# Patient Record
Sex: Male | Born: 1965
Health system: Southern US, Community
[De-identification: ages and names within clinical notes are randomized; demographics above are authoritative.]

## PROBLEM LIST (undated history)

## (undated) DIAGNOSIS — E7800 Pure hypercholesterolemia, unspecified: Secondary | ICD-10-CM

## (undated) DIAGNOSIS — M519 Unspecified thoracic, thoracolumbar and lumbosacral intervertebral disc disorder: Secondary | ICD-10-CM

## (undated) DIAGNOSIS — I10 Essential (primary) hypertension: Secondary | ICD-10-CM

## (undated) SURGERY — Surgical Case
Anesthesia: *Unknown

---

## 2004-04-11 ENCOUNTER — Ambulatory Visit: Payer: Self-pay | Admitting: Family Medicine

## 2004-08-08 ENCOUNTER — Ambulatory Visit: Payer: Self-pay | Admitting: Family Medicine

## 2006-05-12 DIAGNOSIS — R209 Unspecified disturbances of skin sensation: Secondary | ICD-10-CM | POA: Insufficient documentation

## 2006-05-26 ENCOUNTER — Ambulatory Visit: Payer: Self-pay | Admitting: Internal Medicine

## 2006-05-26 ENCOUNTER — Encounter (INDEPENDENT_AMBULATORY_CARE_PROVIDER_SITE_OTHER): Payer: Self-pay | Admitting: *Deleted

## 2006-05-26 ENCOUNTER — Encounter: Payer: Self-pay | Admitting: Family Medicine

## 2008-02-17 ENCOUNTER — Ambulatory Visit: Payer: Self-pay | Admitting: Internal Medicine

## 2008-02-17 DIAGNOSIS — J069 Acute upper respiratory infection, unspecified: Secondary | ICD-10-CM | POA: Insufficient documentation

## 2016-08-17 DIAGNOSIS — I10 Essential (primary) hypertension: Secondary | ICD-10-CM | POA: Diagnosis not present

## 2016-10-06 DIAGNOSIS — H04123 Dry eye syndrome of bilateral lacrimal glands: Secondary | ICD-10-CM | POA: Diagnosis not present

## 2016-10-29 DIAGNOSIS — Z79899 Other long term (current) drug therapy: Secondary | ICD-10-CM | POA: Diagnosis not present

## 2016-10-29 DIAGNOSIS — Z13220 Encounter for screening for lipoid disorders: Secondary | ICD-10-CM | POA: Diagnosis not present

## 2016-10-29 DIAGNOSIS — Z0000 Encounter for general adult medical examination without abnormal findings: Secondary | ICD-10-CM | POA: Diagnosis not present

## 2016-10-29 DIAGNOSIS — I10 Essential (primary) hypertension: Secondary | ICD-10-CM | POA: Diagnosis not present

## 2016-10-29 DIAGNOSIS — Z125 Encounter for screening for malignant neoplasm of prostate: Secondary | ICD-10-CM | POA: Diagnosis not present

## 2016-10-29 DIAGNOSIS — J9811 Atelectasis: Secondary | ICD-10-CM | POA: Diagnosis not present

## 2016-11-04 DIAGNOSIS — Z1211 Encounter for screening for malignant neoplasm of colon: Secondary | ICD-10-CM | POA: Diagnosis not present

## 2016-11-09 DIAGNOSIS — I10 Essential (primary) hypertension: Secondary | ICD-10-CM | POA: Diagnosis not present

## 2016-11-10 DIAGNOSIS — H04123 Dry eye syndrome of bilateral lacrimal glands: Secondary | ICD-10-CM | POA: Diagnosis not present

## 2016-12-07 DIAGNOSIS — M9903 Segmental and somatic dysfunction of lumbar region: Secondary | ICD-10-CM | POA: Diagnosis not present

## 2016-12-07 DIAGNOSIS — M5432 Sciatica, left side: Secondary | ICD-10-CM | POA: Diagnosis not present

## 2016-12-07 DIAGNOSIS — M62830 Muscle spasm of back: Secondary | ICD-10-CM | POA: Diagnosis not present

## 2016-12-08 DIAGNOSIS — M5137 Other intervertebral disc degeneration, lumbosacral region: Secondary | ICD-10-CM | POA: Diagnosis not present

## 2016-12-08 DIAGNOSIS — I10 Essential (primary) hypertension: Secondary | ICD-10-CM | POA: Diagnosis not present

## 2016-12-08 DIAGNOSIS — M62830 Muscle spasm of back: Secondary | ICD-10-CM | POA: Diagnosis not present

## 2016-12-08 DIAGNOSIS — M9903 Segmental and somatic dysfunction of lumbar region: Secondary | ICD-10-CM | POA: Diagnosis not present

## 2016-12-08 DIAGNOSIS — M5432 Sciatica, left side: Secondary | ICD-10-CM | POA: Diagnosis not present

## 2016-12-09 DIAGNOSIS — M5432 Sciatica, left side: Secondary | ICD-10-CM | POA: Diagnosis not present

## 2016-12-09 DIAGNOSIS — M62830 Muscle spasm of back: Secondary | ICD-10-CM | POA: Diagnosis not present

## 2016-12-09 DIAGNOSIS — M9903 Segmental and somatic dysfunction of lumbar region: Secondary | ICD-10-CM | POA: Diagnosis not present

## 2016-12-15 ENCOUNTER — Other Ambulatory Visit: Payer: Self-pay | Admitting: Internal Medicine

## 2016-12-15 DIAGNOSIS — M5137 Other intervertebral disc degeneration, lumbosacral region: Secondary | ICD-10-CM

## 2016-12-23 ENCOUNTER — Ambulatory Visit: Payer: Commercial Managed Care - PPO

## 2017-01-07 ENCOUNTER — Other Ambulatory Visit: Payer: Self-pay | Admitting: Internal Medicine

## 2017-01-07 DIAGNOSIS — M5137 Other intervertebral disc degeneration, lumbosacral region: Secondary | ICD-10-CM

## 2017-01-16 ENCOUNTER — Ambulatory Visit
Admission: RE | Admit: 2017-01-16 | Discharge: 2017-01-16 | Disposition: A | Discharge status: Home / Self Care | Payer: Commercial Managed Care - PPO | Source: Ambulatory Visit | Attending: Internal Medicine | Admitting: Internal Medicine

## 2017-01-16 DIAGNOSIS — M47816 Spondylosis without myelopathy or radiculopathy, lumbar region: Secondary | ICD-10-CM | POA: Insufficient documentation

## 2017-01-16 DIAGNOSIS — M5137 Other intervertebral disc degeneration, lumbosacral region: Secondary | ICD-10-CM

## 2017-01-16 DIAGNOSIS — M5136 Other intervertebral disc degeneration, lumbar region: Secondary | ICD-10-CM | POA: Insufficient documentation

## 2017-01-16 DIAGNOSIS — M545 Low back pain: Secondary | ICD-10-CM | POA: Diagnosis not present

## 2017-02-01 DIAGNOSIS — I10 Essential (primary) hypertension: Secondary | ICD-10-CM | POA: Diagnosis not present

## 2017-02-01 DIAGNOSIS — M5137 Other intervertebral disc degeneration, lumbosacral region: Secondary | ICD-10-CM | POA: Diagnosis not present

## 2017-02-09 DIAGNOSIS — M5489 Other dorsalgia: Secondary | ICD-10-CM | POA: Diagnosis not present

## 2017-02-09 DIAGNOSIS — M5136 Other intervertebral disc degeneration, lumbar region: Secondary | ICD-10-CM | POA: Diagnosis not present

## 2017-02-20 DIAGNOSIS — J069 Acute upper respiratory infection, unspecified: Secondary | ICD-10-CM | POA: Diagnosis not present

## 2017-04-08 DIAGNOSIS — J069 Acute upper respiratory infection, unspecified: Secondary | ICD-10-CM | POA: Diagnosis not present

## 2017-05-03 DIAGNOSIS — M79642 Pain in left hand: Secondary | ICD-10-CM | POA: Diagnosis not present

## 2017-05-03 DIAGNOSIS — S62357A Nondisplaced fracture of shaft of fifth metacarpal bone, left hand, initial encounter for closed fracture: Secondary | ICD-10-CM | POA: Diagnosis not present

## 2017-05-03 DIAGNOSIS — S6992XA Unspecified injury of left wrist, hand and finger(s), initial encounter: Secondary | ICD-10-CM | POA: Diagnosis not present

## 2017-05-03 DIAGNOSIS — S60222A Contusion of left hand, initial encounter: Secondary | ICD-10-CM | POA: Diagnosis not present

## 2017-05-17 DIAGNOSIS — S60222D Contusion of left hand, subsequent encounter: Secondary | ICD-10-CM | POA: Diagnosis not present

## 2017-05-17 DIAGNOSIS — S6992XD Unspecified injury of left wrist, hand and finger(s), subsequent encounter: Secondary | ICD-10-CM | POA: Diagnosis not present

## 2017-05-18 DIAGNOSIS — S6992XA Unspecified injury of left wrist, hand and finger(s), initial encounter: Secondary | ICD-10-CM | POA: Diagnosis not present

## 2017-05-19 DIAGNOSIS — I10 Essential (primary) hypertension: Secondary | ICD-10-CM | POA: Diagnosis not present

## 2017-05-19 DIAGNOSIS — Z79899 Other long term (current) drug therapy: Secondary | ICD-10-CM | POA: Diagnosis not present

## 2017-05-19 DIAGNOSIS — M5136 Other intervertebral disc degeneration, lumbar region: Secondary | ICD-10-CM | POA: Diagnosis not present

## 2017-06-06 ENCOUNTER — Other Ambulatory Visit: Payer: Self-pay

## 2017-06-06 ENCOUNTER — Emergency Department
Admission: EM | Admit: 2017-06-06 | Discharge: 2017-06-07 | Disposition: A | Discharge status: Home / Self Care | Payer: Commercial Managed Care - PPO | Attending: Emergency Medicine | Admitting: Emergency Medicine

## 2017-06-06 ENCOUNTER — Encounter: Payer: Self-pay | Admitting: Emergency Medicine

## 2017-06-06 ENCOUNTER — Emergency Department: Payer: Commercial Managed Care - PPO

## 2017-06-06 DIAGNOSIS — Y999 Unspecified external cause status: Secondary | ICD-10-CM | POA: Insufficient documentation

## 2017-06-06 DIAGNOSIS — S01511A Laceration without foreign body of lip, initial encounter: Secondary | ICD-10-CM | POA: Diagnosis not present

## 2017-06-06 DIAGNOSIS — M542 Cervicalgia: Secondary | ICD-10-CM | POA: Insufficient documentation

## 2017-06-06 DIAGNOSIS — M7918 Myalgia, other site: Secondary | ICD-10-CM

## 2017-06-06 DIAGNOSIS — I10 Essential (primary) hypertension: Secondary | ICD-10-CM | POA: Diagnosis not present

## 2017-06-06 DIAGNOSIS — W0110XA Fall on same level from slipping, tripping and stumbling with subsequent striking against unspecified object, initial encounter: Secondary | ICD-10-CM | POA: Diagnosis not present

## 2017-06-06 DIAGNOSIS — S0990XA Unspecified injury of head, initial encounter: Secondary | ICD-10-CM

## 2017-06-06 DIAGNOSIS — Y939 Activity, unspecified: Secondary | ICD-10-CM | POA: Diagnosis not present

## 2017-06-06 DIAGNOSIS — W19XXXA Unspecified fall, initial encounter: Secondary | ICD-10-CM

## 2017-06-06 DIAGNOSIS — Y929 Unspecified place or not applicable: Secondary | ICD-10-CM | POA: Insufficient documentation

## 2017-06-06 HISTORY — DX: Essential (primary) hypertension: I10

## 2017-06-06 NOTE — ED Triage Notes (Signed)
Pt says he tripped over his new puppy and fell into a tree; laceration to the middle of his bottom lip; chipped teeth on the bottom; also c/o neck pain and back of his head; neck pain is on the back right side; no cervical tenderness;

## 2017-06-06 NOTE — ED Notes (Signed)
Pt waiting patiently in [facility] waiting area; updated on wait time;

## 2017-06-06 NOTE — ED Notes (Signed)
Per CT tech, Brian Ho, Patient refusing CT at this time.

## 2017-06-07 ENCOUNTER — Emergency Department: Payer: Commercial Managed Care - PPO

## 2017-06-07 ENCOUNTER — Other Ambulatory Visit: Payer: Self-pay

## 2017-06-07 DIAGNOSIS — S0990XA Unspecified injury of head, initial encounter: Secondary | ICD-10-CM | POA: Diagnosis not present

## 2017-06-07 DIAGNOSIS — S01511A Laceration without foreign body of lip, initial encounter: Secondary | ICD-10-CM | POA: Diagnosis not present

## 2017-06-07 DIAGNOSIS — S0993XA Unspecified injury of face, initial encounter: Secondary | ICD-10-CM | POA: Diagnosis not present

## 2017-06-07 DIAGNOSIS — M542 Cervicalgia: Secondary | ICD-10-CM | POA: Diagnosis not present

## 2017-06-07 DIAGNOSIS — I10 Essential (primary) hypertension: Secondary | ICD-10-CM | POA: Diagnosis not present

## 2017-06-07 MED ORDER — LIDOCAINE-EPINEPHRINE-TETRACAINE (LET) SOLUTION
3.0000 mL | Freq: Once | NASAL | Status: AC
  Administered 2017-06-07: 3 mL via TOPICAL
  Filled 2017-06-07: qty 3

## 2017-06-07 MED ORDER — IBUPROFEN 800 MG PO TABS: 800.0000 mg | ORAL_TABLET | Freq: Three times a day (TID) | ORAL | 0 refills | Status: AC | PRN

## 2017-06-07 MED ORDER — IBUPROFEN 600 MG PO TABS
600.0000 mg | ORAL_TABLET | Freq: Once | ORAL | Status: AC
  Administered 2017-06-07: 600 mg via ORAL
  Filled 2017-06-07: qty 1

## 2017-06-07 MED ORDER — LIDOCAINE HCL (PF) 1 % IJ SOLN
5.0000 mL | Freq: Once | INTRAMUSCULAR | Status: AC
  Administered 2017-06-07: 5 mL via INTRADERMAL
  Filled 2017-06-07: qty 5

## 2017-06-07 NOTE — ED Provider Notes (Signed)
[facility] Emergency Department Provider Note   ____________________________________________   First MD Initiated Contact with Patient 06/07/17 0014     (approximate)  I have reviewed the triage vital signs and the nursing notes.   HISTORY  Chief Complaint Lip Laceration    HPI Brian Ho is a 52 y.o. male who comes into the hospital today with a lip laceration.  The patient states that he was trying to put on his flip-flops and his dog got under his feet.  He tripped over the dog and he fell and hit something.  The patient states that his bottom teeth seem to go through his lip.  He has some neck pain but reports that it is not uncommon for him to have neck pain.  He also has some shoulder pain but he rates his pain a 2-3 out of 10 in intensity.  The patient denies any loss of consciousness dizziness or lightheadedness.  He is here today for repair and evaluation.   Past Medical History:  Diagnosis Date  . Hypertension     Patient Active Problem List   Diagnosis Date Noted  . URI 02/17/2008  . PARESTHESIA 05/12/2006    History reviewed. No pertinent surgical history.  Prior to Admission medications   Medication Sig Start Date End Date Taking? Authorizing Provider  amLODipine (NORVASC) 5 MG tablet Take 5 mg by mouth daily.   Yes [provider]  lisinopril (PRINIVIL,ZESTRIL) 5 MG tablet Take 5 mg by mouth daily.   Yes [provider]  ibuprofen (ADVIL,MOTRIN) 800 MG tablet Take 1 tablet (800 mg total) by mouth every 8 (eight) hours as needed. 06/07/17   Rebecka Apley, MD    Allergies Patient has no known allergies.  History reviewed. No pertinent family history.  Social History Social History   Tobacco Use  . Smoking status: Never Smoker  . Smokeless tobacco: Never Used  Substance Use Topics  . Alcohol use: Yes  . Drug use: Never    Review of Systems  Constitutional: No fever/chills Eyes: No visual  changes. ENT: No sore throat. Cardiovascular: Denies chest pain. Respiratory: Denies shortness of breath. Gastrointestinal: No abdominal pain.  No nausea, no vomiting.  No diarrhea.  No constipation. Genitourinary: Negative for dysuria. Musculoskeletal: Neck pain Skin: Laceration to lower lip Neurological: Headache   ____________________________________________   PHYSICAL EXAM:  VITAL SIGNS: ED Triage Vitals  Enc Vitals Group     BP 06/06/17 2058 (!) 139/96     Pulse Rate 06/06/17 2058 83     Resp 06/06/17 2058 17     Temp 06/06/17 2058 98.6 F (37 C)     Temp Source 06/06/17 2058 Oral     SpO2 06/06/17 2058 97 %     Weight 06/06/17 2059 260 lb (117.9 kg)     Height 06/06/17 2059 5' 10.5" (1.791 m)     Head Circumference --      Peak Flow --      Pain Score 06/06/17 2058 3     Pain Loc --      Pain Edu? --      Excl. in GC? --    Constitutional: Alert and oriented. Well appearing and in moderate distress. Eyes: Conjunctivae are normal. PERRL. EOMI. Head: Atraumatic. Nose: No congestion/rhinnorhea. Mouth/Throat: Mucous membranes are moist.  Oropharynx non-erythematous. Laceration to lower lip with bruising, laceration goes through the vermillion border of lower lip Neck: No Midline cervical spine tenderness to palpation Cardiovascular:  Normal rate, regular rhythm. Grossly normal heart sounds.  Good peripheral circulation. Respiratory: Normal respiratory effort.  No retractions. Lungs CTAB. Gastrointestinal: Soft and nontender. No distention.  Positive bowel sounds Musculoskeletal: No lower extremity tenderness nor edema.   Neurologic:  Normal speech and language.  Skin:  Skin is warm, dry, laceration to lower lip which goes through the vermillion border approximately 2.5cm in length. Psychiatric: Mood and affect are normal.   ____________________________________________   LABS (all labs ordered are listed, but only abnormal results are displayed)  Labs Reviewed -  No data to display ____________________________________________  EKG  none ____________________________________________  RADIOLOGY  ED MD interpretation:  CT head and max face: No evidence of traumatic intracranial injury or fracture, no evidence of fracture or dislocation with regard to the maxillofacial structures, Mild soft tissue swelling at the lower lip, Known chipped teeth not well characterized on CT.  Official radiology report(s): Ct Head Wo Contrast  Result Date: 06/07/2017 CLINICAL DATA:  Tripped over puppy and fell into tree, with laceration at the lower lip and chipped teeth. Neck pain and pain at the back of the head. EXAM: CT HEAD WITHOUT CONTRAST CT MAXILLOFACIAL WITHOUT CONTRAST TECHNIQUE: Multidetector CT imaging of the head and maxillofacial structures were performed using the standard protocol without intravenous contrast. Multiplanar CT image reconstructions of the maxillofacial structures were also generated. COMPARISON:  None. FINDINGS: CT HEAD FINDINGS Brain: No evidence of acute infarction, hemorrhage, hydrocephalus, extra-axial collection or mass lesion/mass effect. The posterior fossa, including the cerebellum, brainstem and fourth ventricle, is within normal limits. The third and lateral ventricles, and basal ganglia are unremarkable in appearance. The cerebral hemispheres are symmetric in appearance, with normal gray-white differentiation. No mass effect or midline shift is seen. Vascular: No hyperdense vessel or unexpected calcification. Skull: There is no evidence of fracture; visualized osseous structures are unremarkable in appearance. Other: No significant soft tissue abnormalities are seen. CT MAXILLOFACIAL FINDINGS Osseous: There is no evidence of fracture or dislocation. The maxilla and mandible appear intact. The nasal bone is unremarkable in appearance. Known chipped teeth are not well characterized on CT. There is slight irregularity of the central maxillary  incisors. Orbits: The orbits are intact bilaterally. Sinuses: The visualized paranasal sinuses and mastoid air cells are well-aerated. Soft tissues: Mild soft tissue swelling is noted at the lower lip. The parapharyngeal fat planes are preserved. The nasopharynx, oropharynx and hypopharynx are unremarkable in appearance. The visualized portions of the valleculae and piriform sinuses are grossly unremarkable. The parotid and submandibular glands are within normal limits. No cervical lymphadenopathy is seen. IMPRESSION: 1. No evidence of traumatic intracranial injury or fracture. 2. No evidence of fracture or dislocation with regard to the maxillofacial structures. 3. Mild soft tissue swelling at the lower lip. 4. Known chipped teeth are not well characterized on CT. Slight irregularity of the central maxillary incisors. Electronically Signed   By: Roanna Raider M.D.   On: 06/07/2017 01:30   Ct Maxillofacial Wo Contrast  Result Date: 06/07/2017 CLINICAL DATA:  Tripped over puppy and fell into tree, with laceration at the lower lip and chipped teeth. Neck pain and pain at the back of the head. EXAM: CT HEAD WITHOUT CONTRAST CT MAXILLOFACIAL WITHOUT CONTRAST TECHNIQUE: Multidetector CT imaging of the head and maxillofacial structures were performed using the standard protocol without intravenous contrast. Multiplanar CT image reconstructions of the maxillofacial structures were also generated. COMPARISON:  None. FINDINGS: CT HEAD FINDINGS Brain: No evidence of acute infarction, hemorrhage, hydrocephalus, extra-axial collection  or mass lesion/mass effect. The posterior fossa, including the cerebellum, brainstem and fourth ventricle, is within normal limits. The third and lateral ventricles, and basal ganglia are unremarkable in appearance. The cerebral hemispheres are symmetric in appearance, with normal gray-white differentiation. No mass effect or midline shift is seen. Vascular: No hyperdense vessel or unexpected  calcification. Skull: There is no evidence of fracture; visualized osseous structures are unremarkable in appearance. Other: No significant soft tissue abnormalities are seen. CT MAXILLOFACIAL FINDINGS Osseous: There is no evidence of fracture or dislocation. The maxilla and mandible appear intact. The nasal bone is unremarkable in appearance. Known chipped teeth are not well characterized on CT. There is slight irregularity of the central maxillary incisors. Orbits: The orbits are intact bilaterally. Sinuses: The visualized paranasal sinuses and mastoid air cells are well-aerated. Soft tissues: Mild soft tissue swelling is noted at the lower lip. The parapharyngeal fat planes are preserved. The nasopharynx, oropharynx and hypopharynx are unremarkable in appearance. The visualized portions of the valleculae and piriform sinuses are grossly unremarkable. The parotid and submandibular glands are within normal limits. No cervical lymphadenopathy is seen. IMPRESSION: 1. No evidence of traumatic intracranial injury or fracture. 2. No evidence of fracture or dislocation with regard to the maxillofacial structures. 3. Mild soft tissue swelling at the lower lip. 4. Known chipped teeth are not well characterized on CT. Slight irregularity of the central maxillary incisors. Electronically Signed   By: Roanna RaiderJeffery  Chang M.D.   On: 06/07/2017 01:30    ____________________________________________   PROCEDURES  Procedure(s) performed: please, see procedure note(s).  Marland Kitchen..Laceration Repair Date/Time: 06/07/2017 1:30 AM Performed by: Rebecka ApleyWebster, Aniket Paye P, MD Authorized by: Rebecka ApleyWebster, [place] Kirchner P, MD   Consent:    Consent obtained:  Verbal   Consent given by:  Patient   Risks discussed:  Infection, pain, retained foreign body, poor cosmetic result and poor wound healing Anesthesia (see MAR for exact dosages):    Anesthesia method:  Local infiltration and topical application   Topical anesthetic:  LET   Local anesthetic:   Lidocaine 1% w/o epi Laceration details:    Location:  Lip   Lip location:  Lower exterior lip   Length (cm):  2.5 Repair type:    Repair type:  Simple Pre-procedure details:    Preparation:  Patient was prepped and draped in usual sterile fashion Exploration:    Hemostasis achieved with:  Direct pressure   Wound exploration: entire depth of wound probed and visualized     Contaminated: no   Treatment:    Area cleansed with:  Saline and Betadine   Amount of cleaning:  Standard   Irrigation solution:  Sterile saline   Visualized foreign bodies/material removed: no   Skin repair:    Repair method:  Sutures   Suture size:  5-0   Suture material:  Fast-absorbing gut   Suture technique:  Simple interrupted   Number of sutures:  7 Approximation:    Approximation:  Close Post-procedure details:    Dressing:  Open (no dressing)   Patient tolerance of procedure:  Tolerated well, no immediate complications    Critical Care performed: No  ____________________________________________   INITIAL IMPRESSION / ASSESSMENT AND PLAN / ED COURSE  As part of my medical decision making, I reviewed the following data within the electronic MEDICAL RECORD NUMBER Notes from prior ED visits and [place] Controlled Substance Database   This is a 52 year old male who comes into the hospital today after a fall this evening.  The patient  injured his lip.  We did send the patient for CT scan of his head and max face.  I also did repair the injury.  The patient did receive a dose of ibuprofen for his pain.  He will be discharged home and encouraged to follow-up with his primary care physician.  His sutures are dissolvable so he does not have to have them removed.      ____________________________________________   FINAL CLINICAL IMPRESSION(S) / ED DIAGNOSES  Final diagnoses:  Lip laceration, initial encounter  Fall, initial encounter  Injury of head, initial encounter  Musculoskeletal pain     ED  Discharge Orders        Ordered    ibuprofen (ADVIL,MOTRIN) 800 MG tablet  Every 8 hours PRN     06/07/17 0255       Note:  This document was prepared using Dragon voice recognition software and may include unintentional dictation errors.    Rebecka Apley, MD 06/07/17 [id number]

## 2017-06-07 NOTE — ED Notes (Signed)
Pt changed mind about receiving CT scan after this RN answered questions about procedure and expectations

## 2017-06-07 NOTE — Discharge Instructions (Addendum)
Please follow up with your primary care physician for further eval

## 2017-06-07 NOTE — ED Notes (Signed)
Patient transported to CT 

## 2017-07-16 DIAGNOSIS — H04123 Dry eye syndrome of bilateral lacrimal glands: Secondary | ICD-10-CM | POA: Diagnosis not present

## 2017-07-16 DIAGNOSIS — H5213 Myopia, bilateral: Secondary | ICD-10-CM | POA: Diagnosis not present

## 2017-11-19 DIAGNOSIS — Z125 Encounter for screening for malignant neoplasm of prostate: Secondary | ICD-10-CM | POA: Diagnosis not present

## 2017-11-19 DIAGNOSIS — I10 Essential (primary) hypertension: Secondary | ICD-10-CM | POA: Diagnosis not present

## 2017-11-19 DIAGNOSIS — E7800 Pure hypercholesterolemia, unspecified: Secondary | ICD-10-CM | POA: Diagnosis not present

## 2017-11-19 DIAGNOSIS — Z0000 Encounter for general adult medical examination without abnormal findings: Secondary | ICD-10-CM | POA: Diagnosis not present

## 2017-11-19 DIAGNOSIS — Z79899 Other long term (current) drug therapy: Secondary | ICD-10-CM | POA: Diagnosis not present

## 2018-01-10 DIAGNOSIS — R0789 Other chest pain: Secondary | ICD-10-CM | POA: Diagnosis not present

## 2018-01-10 DIAGNOSIS — Z1211 Encounter for screening for malignant neoplasm of colon: Secondary | ICD-10-CM | POA: Diagnosis not present

## 2018-02-24 DIAGNOSIS — R0789 Other chest pain: Secondary | ICD-10-CM | POA: Diagnosis not present

## 2018-07-29 ENCOUNTER — Other Ambulatory Visit
Admission: RE | Admit: 2018-07-29 | Discharge: 2018-07-29 | Disposition: A | Discharge status: Home / Self Care | Payer: Commercial Managed Care - PPO | Source: Ambulatory Visit | Attending: Internal Medicine | Admitting: Internal Medicine

## 2018-07-29 ENCOUNTER — Other Ambulatory Visit: Payer: Self-pay

## 2018-07-29 DIAGNOSIS — Z1159 Encounter for screening for other viral diseases: Secondary | ICD-10-CM | POA: Diagnosis not present

## 2018-07-30 LAB — SARS CORONAVIRUS 2 (TAT 6-24 HRS): SARS Coronavirus 2: NEGATIVE

## 2018-08-02 ENCOUNTER — Ambulatory Visit: Payer: Commercial Managed Care - PPO | Admitting: Anesthesiology

## 2018-08-02 ENCOUNTER — Encounter
Admission: RE | Disposition: A | Discharge status: Home / Self Care | Payer: Self-pay | Source: Home / Self Care | Attending: Internal Medicine

## 2018-08-02 ENCOUNTER — Other Ambulatory Visit: Payer: Self-pay

## 2018-08-02 ENCOUNTER — Ambulatory Visit
Admission: RE | Admit: 2018-08-02 | Discharge: 2018-08-02 | Disposition: A | Discharge status: Home / Self Care | Payer: Commercial Managed Care - PPO | Attending: Internal Medicine | Admitting: Internal Medicine

## 2018-08-02 DIAGNOSIS — Z79899 Other long term (current) drug therapy: Secondary | ICD-10-CM | POA: Diagnosis not present

## 2018-08-02 DIAGNOSIS — K5730 Diverticulosis of large intestine without perforation or abscess without bleeding: Secondary | ICD-10-CM | POA: Insufficient documentation

## 2018-08-02 DIAGNOSIS — K640 First degree hemorrhoids: Secondary | ICD-10-CM | POA: Diagnosis not present

## 2018-08-02 DIAGNOSIS — Z1211 Encounter for screening for malignant neoplasm of colon: Secondary | ICD-10-CM | POA: Diagnosis present

## 2018-08-02 DIAGNOSIS — I10 Essential (primary) hypertension: Secondary | ICD-10-CM | POA: Insufficient documentation

## 2018-08-02 HISTORY — PX: COLONOSCOPY WITH PROPOFOL: SHX5780

## 2018-08-02 HISTORY — DX: Unspecified thoracic, thoracolumbar and lumbosacral intervertebral disc disorder: M51.9

## 2018-08-02 HISTORY — DX: Pure hypercholesterolemia, unspecified: E78.00

## 2018-08-02 SURGERY — COLONOSCOPY WITH PROPOFOL
Anesthesia: General

## 2018-08-02 MED ORDER — MIDAZOLAM HCL 2 MG/2ML IJ SOLN
INTRAMUSCULAR | Status: AC
  Filled 2018-08-02: qty 2

## 2018-08-02 MED ORDER — PROPOFOL 500 MG/50ML IV EMUL
INTRAVENOUS | Status: DC | PRN
  Administered 2018-08-02: 180 ug/kg/min via INTRAVENOUS

## 2018-08-02 MED ORDER — PROPOFOL 10 MG/ML IV BOLUS
INTRAVENOUS | Status: DC | PRN
  Administered 2018-08-02: 80 mg via INTRAVENOUS

## 2018-08-02 MED ORDER — LIDOCAINE HCL (PF) 2 % IJ SOLN
INTRAMUSCULAR | Status: AC
  Filled 2018-08-02: qty 10

## 2018-08-02 MED ORDER — PROPOFOL 500 MG/50ML IV EMUL
INTRAVENOUS | Status: AC
Start: 2018-08-02 — End: ?
  Filled 2018-08-02: qty 50

## 2018-08-02 MED ORDER — MIDAZOLAM HCL 2 MG/2ML IJ SOLN
INTRAMUSCULAR | Status: DC | PRN
  Administered 2018-08-02 (×2): 1 mg via INTRAVENOUS

## 2018-08-02 MED ORDER — SODIUM CHLORIDE 0.9 % IV SOLN
INTRAVENOUS | Status: DC
  Administered 2018-08-02: 11:00:00 via INTRAVENOUS
  Administered 2018-08-02: 1000 mL via INTRAVENOUS

## 2018-08-02 NOTE — Transfer of Care (Signed)
Immediate Anesthesia Transfer of Care Note  Patient: Brian Ho  Procedure(s) Performed: COLONOSCOPY WITH PROPOFOL (N/A )  Patient Location: PACU  Anesthesia Type:General  Level of Consciousness: awake and alert   Airway & Oxygen Therapy: Patient Spontanous Breathing and Patient connected to nasal cannula oxygen  Post-op Assessment: Report given to RN and Post -op Vital signs reviewed and stable  Post vital signs: Reviewed and stable  Last Vitals:  Vitals Value Taken Time  BP    Temp    Pulse    Resp    SpO2      Last Pain:  Vitals:   08/02/18 1038  TempSrc: Tympanic  PainSc: 1          Complications: No apparent anesthesia complications

## 2018-08-02 NOTE — H&P (Signed)
Outpatient short stay form Pre-procedure 08/02/2018 11:27 AM Brian Ho K. Alice Reichert, M.D.  Primary Physician: Fulton Reek, M.D.  Reason for visit:  Colon cancer screening  History of present illness: Patient presents for colonoscopy for colon cancer screening. The patient denies complaints of abdominal pain, significant change in bowel habits, or rectal bleeding.     Current Facility-Administered Medications:  .  0.9 %  sodium chloride infusion, , Intravenous, Continuous, Holcomb, Benay Pike, MD, Last Rate: 20 mL/hr at 08/02/18 1052, 1,000 mL at 08/02/18 1052  Medications Prior to Admission  Medication Sig Dispense Refill Last Dose  . amLODipine (NORVASC) 5 MG tablet Take 5 mg by mouth daily.   08/02/2018 at 0700  . lisinopril-hydrochlorothiazide (ZESTORETIC) 20-12.5 MG tablet Take 1 tablet by mouth daily.   08/02/2018 at 0700  . ibuprofen (ADVIL,MOTRIN) 800 MG tablet Take 1 tablet (800 mg total) by mouth every 8 (eight) hours as needed. 15 tablet 0   . lisinopril (PRINIVIL,ZESTRIL) 5 MG tablet Take 5 mg by mouth daily.        No Known Allergies   Past Medical History:  Diagnosis Date  . Hypercholesterolemia   . Hypertension   . Lumbosacral disc disease     Review of systems:  Otherwise negative.    Physical Exam  Gen: Alert, oriented. Appears stated age.  HEENT: [place]/AT. PERRLA. Lungs: CTA, no wheezes. CV: RR nl S1, S2. Abd: soft, benign, no masses. BS+ Ext: No edema. Pulses 2+    Planned procedures: Proceed with colonoscopy. The patient understands the nature of the planned procedure, indications, risks, alternatives and potential complications including but not limited to bleeding, infection, perforation, damage to internal organs and possible oversedation/side effects from anesthesia. The patient agrees and gives consent to proceed.  Please refer to procedure notes for findings, recommendations and patient disposition/instructions.     Brian Ho K. Alice Reichert,  M.D. Gastroenterology 08/02/2018  11:27 AM

## 2018-08-02 NOTE — Anesthesia Procedure Notes (Signed)
Date/Time: 08/02/2018 11:00 AM Performed by: Allean Found, CRNA Pre-anesthesia Checklist: Patient identified, Emergency Drugs available, Suction available, Patient being monitored and Timeout performed Oxygen Delivery Method: Nasal cannula Placement Confirmation: positive ETCO2

## 2018-08-02 NOTE — Anesthesia Post-op Follow-up Note (Signed)
Anesthesia QCDR form completed.        

## 2018-08-02 NOTE — Op Note (Signed)
[facility] Gastroenterology Patient Name: Brian Ho Procedure Date: 08/02/2018 11:17 AM MRN: [id number] Account #: [id number] Date of Birth: May 20, 1965 Admit Type: Outpatient Age: 53 [facility] Gender: Male Note Status: Finalized Procedure:            Colonoscopy Indications:          Screening for colorectal malignant neoplasm Providers:            Boykin Nearingeodoro K. Mahin Guardia MD, MD Medicines:            Propofol per Anesthesia Complications:        No immediate complications. Procedure:            Pre-Anesthesia Assessment:                       - The risks and benefits of the procedure and the                        sedation options and risks were discussed with the                        patient. All questions were answered and informed                        consent was obtained.                       - ASA Grade Assessment: III - A patient with severe                        systemic disease.                       - After reviewing the risks and benefits, the patient                        was deemed in satisfactory condition to undergo the                        procedure.                       After obtaining informed consent, the colonoscope was                        passed under direct vision. Throughout the procedure,                        the patient's blood pressure, pulse, and oxygen                        saturations were monitored continuously. The                        Colonoscope was introduced through the anus and                        advanced to the the cecum, identified by appendiceal                        orifice and ileocecal valve. The colonoscopy was  performed without difficulty. The patient tolerated the                        procedure well. The quality of the bowel preparation                        was good. The ileocecal valve, appendiceal orifice, and                        rectum were  photographed. Findings:      The perianal and digital rectal examinations were normal. Pertinent       negatives include normal sphincter tone and no palpable rectal lesions.      Many small-mouthed diverticula were found in the sigmoid colon and       ascending colon.      Non-bleeding internal hemorrhoids were found during retroflexion. The       hemorrhoids were Grade I (internal hemorrhoids that do not prolapse).      The exam was otherwise without abnormality. Impression:           - Diverticulosis in the sigmoid colon and in the                        ascending colon.                       - Non-bleeding internal hemorrhoids.                       - The examination was otherwise normal.                       - No specimens collected. Recommendation:       - Patient has a contact number available for                        emergencies. The signs and symptoms of potential                        delayed complications were discussed with the patient.                        Return to normal activities tomorrow. Written discharge                        instructions were provided to the patient.                       - Resume previous diet.                       - Continue present medications.                       - Repeat colonoscopy in 10 years for screening purposes.                       - The findings and recommendations were discussed with                        the patient.                       -  Return to my office PRN. Procedure Code(s):    --- Professional ---                       O8325, Colorectal cancer screening; colonoscopy on                        individual not meeting criteria for high risk CPT copyright 2019 American Medical Association. All rights reserved. The codes documented in this report are preliminary and upon coder review may  be revised to meet current compliance requirements. Efrain Sella MD, MD 08/02/2018 11:55:03 AM This report has been signed  electronically. Number of Addenda: 0 Note Initiated On: 08/02/2018 11:17 AM Scope Withdrawal Time: 0 hours 9 minutes 47 seconds  Total Procedure Duration: 0 hours 15 minutes 23 seconds  Estimated Blood Loss: Estimated blood loss: none.      [facility]

## 2018-08-02 NOTE — Anesthesia Preprocedure Evaluation (Signed)
Anesthesia Evaluation  Patient identified by MRN, date of birth, ID band Patient awake    Reviewed: Allergy & Precautions, NPO status , Patient's Chart, lab work & pertinent test results  History of Anesthesia Complications Negative for: history of anesthetic complications  Airway Mallampati: II       Dental   Pulmonary neg sleep apnea, neg COPD,           Cardiovascular hypertension, Pt. on medications (-) Past MI and (-) CHF (-) dysrhythmias (-) Valvular Problems/Murmurs     Neuro/Psych neg Seizures    GI/Hepatic Neg liver ROS, neg GERD  ,  Endo/Other  neg diabetes  Renal/GU negative Renal ROS     Musculoskeletal   Abdominal   Peds  Hematology   Anesthesia Other Findings   Reproductive/Obstetrics                             Anesthesia Physical Anesthesia Plan  ASA: II  Anesthesia Plan: General   Post-op Pain Management:    Induction: Intravenous  PONV Risk Score and Plan: 2 and TIVA and Propofol infusion  Airway Management Planned: Nasal Cannula  Additional Equipment:   Intra-op Plan:   Post-operative Plan:   Informed Consent: I have reviewed the patients History and Physical, chart, labs and discussed the procedure including the risks, benefits and alternatives for the proposed anesthesia with the patient or authorized representative who has indicated his/her understanding and acceptance.     Plan Discussed with:   Anesthesia Plan Comments:         Anesthesia Quick Evaluation  

## 2018-08-02 NOTE — Interval H&P Note (Signed)
History and Physical Interval Note:  08/02/2018 11:28 AM  Brian Ho  has presented today for surgery, with the diagnosis of SCREENING.  The various methods of treatment have been discussed with the patient and family. After consideration of risks, benefits and other options for treatment, the patient has consented to  Procedure(s): COLONOSCOPY WITH PROPOFOL (N/A) as a surgical intervention.  The patient's history has been reviewed, patient examined, no change in status, stable for surgery.  I have reviewed the patient's chart and labs.  Questions were answered to the patient's satisfaction.     [place], [place]

## 2018-08-02 NOTE — Anesthesia Postprocedure Evaluation (Signed)
Anesthesia Post Note  Patient: Brian Ho  Procedure(s) Performed: COLONOSCOPY WITH PROPOFOL (N/A )  Patient location during evaluation: Endoscopy Anesthesia Type: General Level of consciousness: awake and alert Pain management: pain level controlled Vital Signs Assessment: post-procedure vital signs reviewed and stable Respiratory status: spontaneous breathing and respiratory function stable Cardiovascular status: stable Anesthetic complications: no     Last Vitals:  Vitals:   08/02/18 1038 08/02/18 1200  BP: (!) 144/71 95/67  Pulse: 75 76  Resp: 20 20  Temp: 36.4 C (!) 36 C  SpO2: 99% 98%    Last Pain:  Vitals:   08/02/18 1200  TempSrc: Tympanic  PainSc:                  KEPHART,WILLIAM K

## 2018-08-03 ENCOUNTER — Encounter: Payer: Self-pay | Admitting: Internal Medicine

## 2019-02-01 IMAGING — CT CT MAXILLOFACIAL W/O CM
4 of 6 series · 16 of 47 positions shown, 18 images · non-contrast
Comparison: None.

CLINICAL DATA: Tripped over puppy and fell into tree, with
laceration at the lower lip and chipped teeth. Neck pain and pain at
the back of the head.

EXAM:
CT HEAD WITHOUT CONTRAST
CT MAXILLOFACIAL WITHOUT CONTRAST
TECHNIQUE: Multidetector CT imaging of the head and maxillofacial structures
were performed using the standard protocol without intravenous
contrast. Multiplanar CT image reconstructions of the maxillofacial
structures were also generated.

[Series 3: head wo · axial · 0.45mm/px · z∈[-162,-62]mm · 5 of 32 slices shown, 7 images]
[im 6/32  brain]
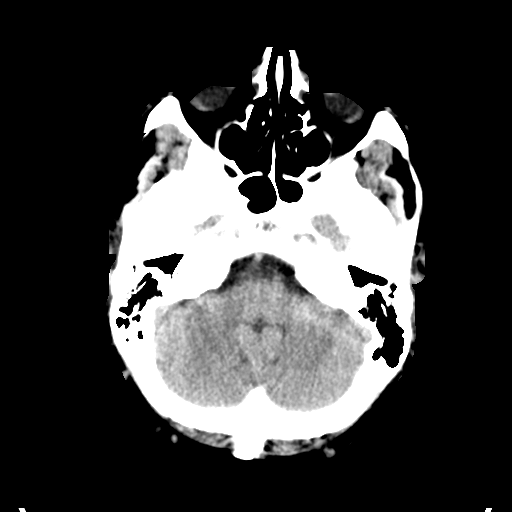
[im 6/32  bone]
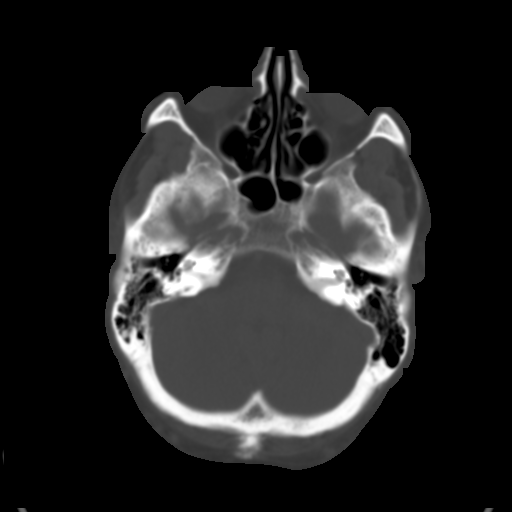
[im 11/32  bone]
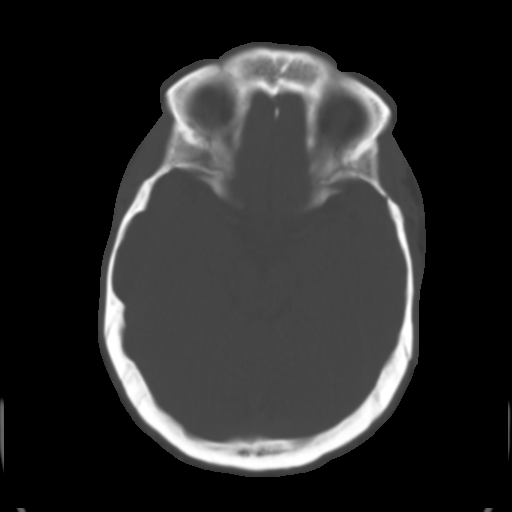
[im 16/32  bone]
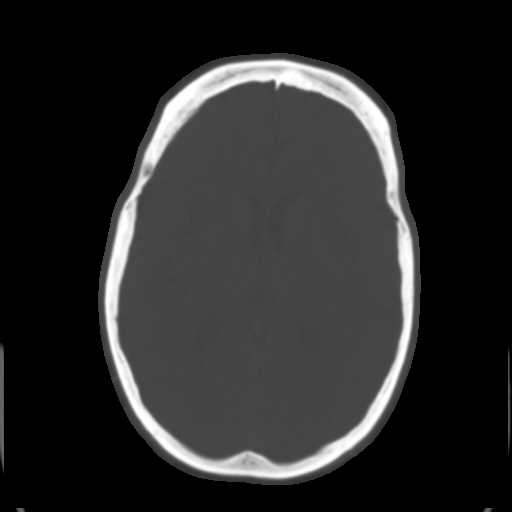
[im 21/32  bone]
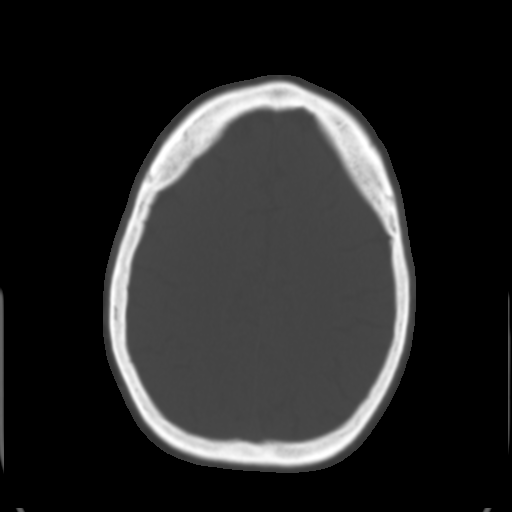
[im 26/32  brain]
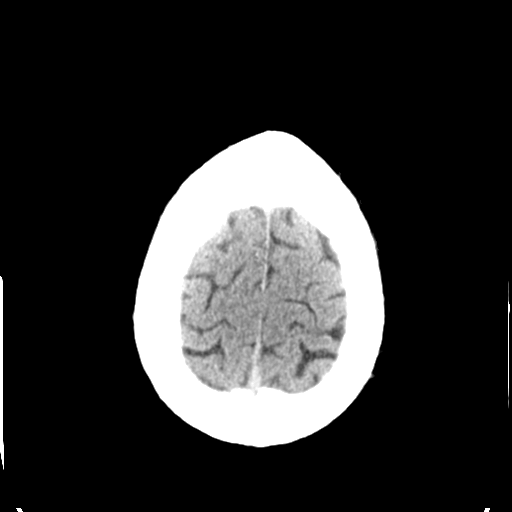
[im 26/32  bone]
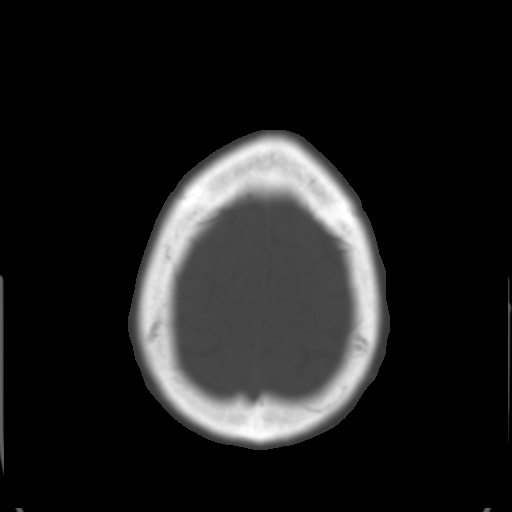

[Series 4: max soft · axial · 0.37mm/px · z∈[-295,-179]mm · 6 of 100 slices shown]
[im 10/100  brain]
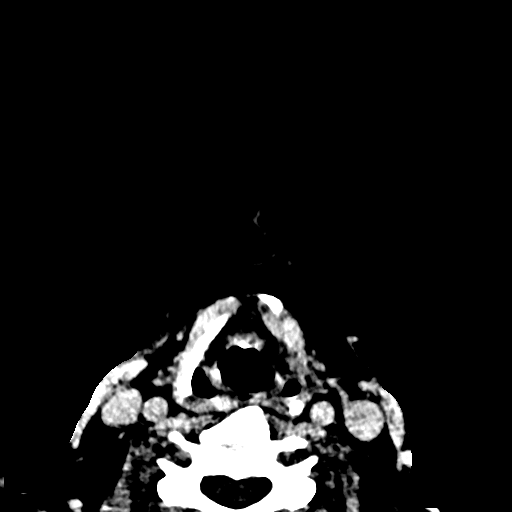
[im 19/100  brain]
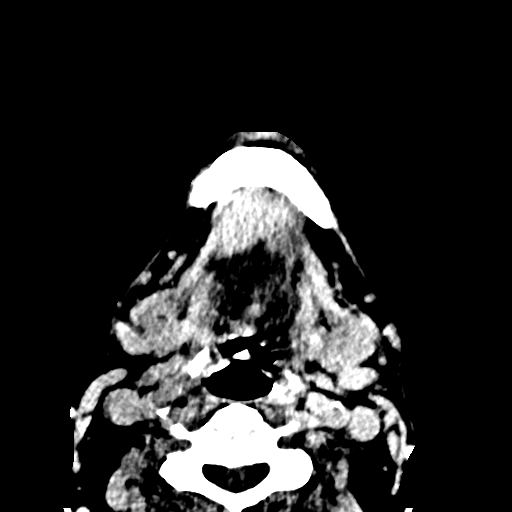
[im 32/100  brain]
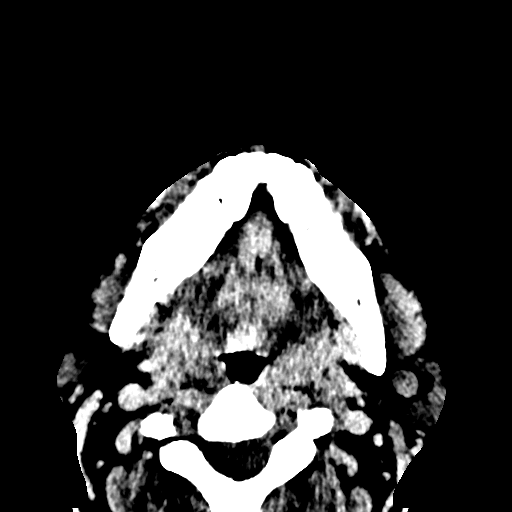
[im 46/100  brain]
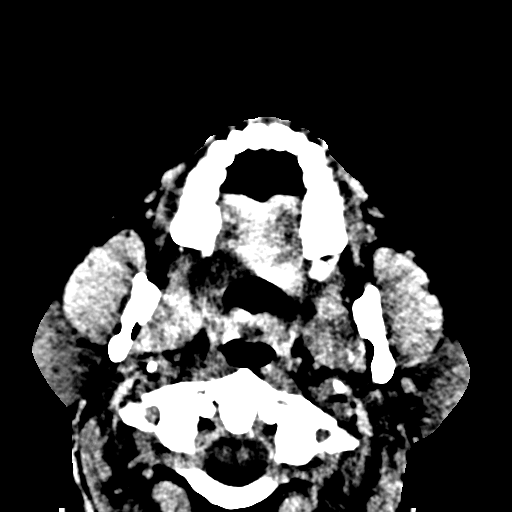
[im 55/100  brain]
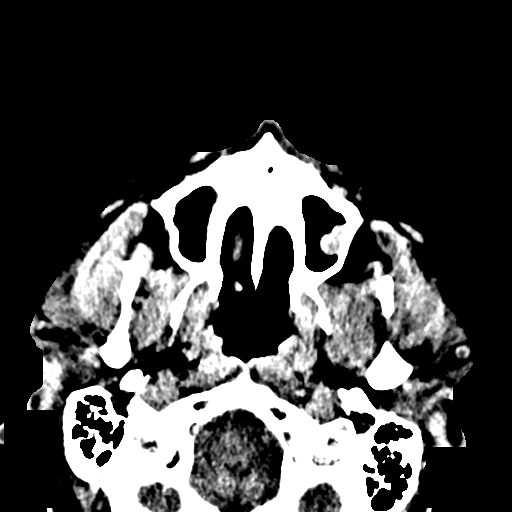
[im 68/100  brain]
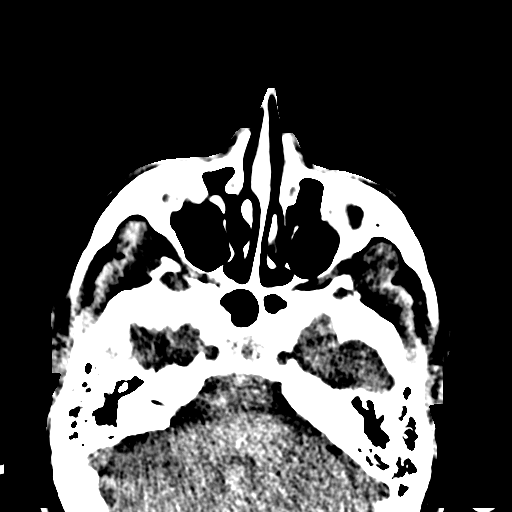

[Series 6: coronal soft tissue · coronal · 0.30mm/px · 3 of 68 slices shown]
[im 20/68  bone]
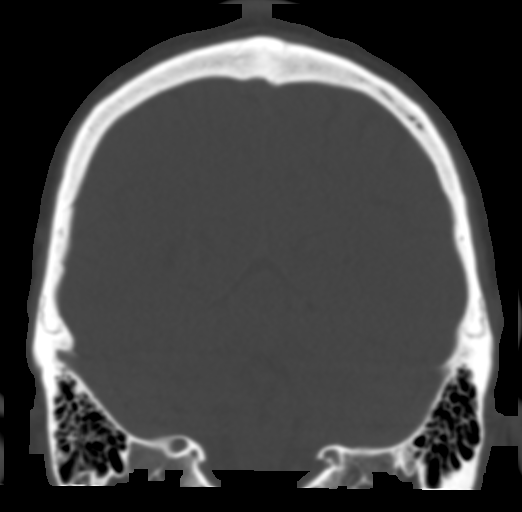
[im 40/68  bone]
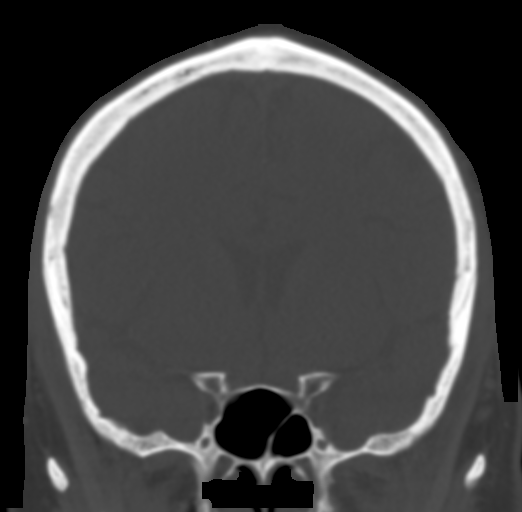
[im 60/68  bone]
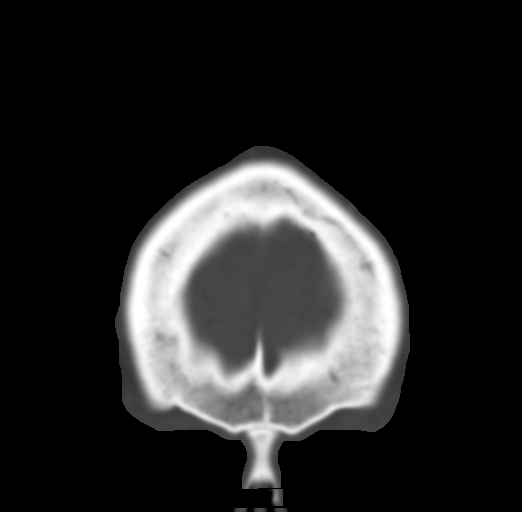

[Series 11: sagittal soft · sagittal · 0.34mm/px · 2 of 92 slices shown]
[im 31/92  bone]
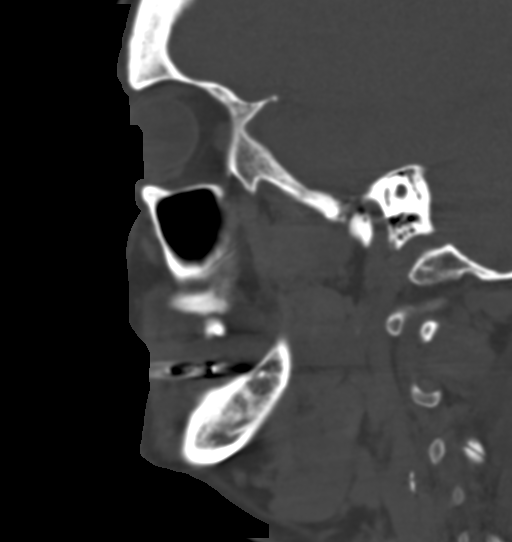
[im 61/92  bone]
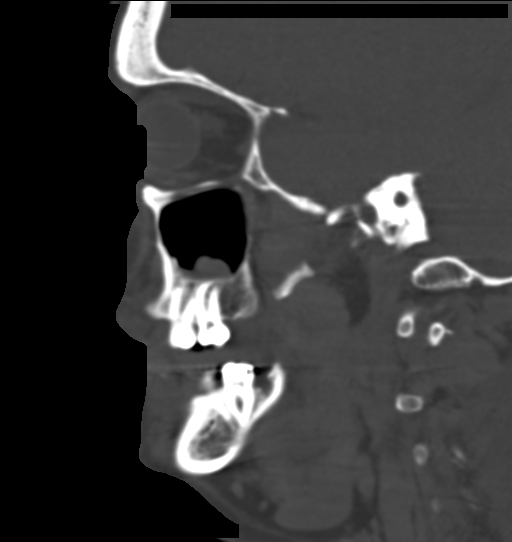

[16 of 47 positions shown; findings below may reference images not displayed]

FINDINGS: CT HEAD FINDINGS

Brain: No evidence of acute infarction, hemorrhage, hydrocephalus,
extra-axial collection or mass lesion/mass effect.

The posterior fossa, including the cerebellum, brainstem and fourth
ventricle, is within normal limits. The third and lateral
ventricles, and basal ganglia are unremarkable in appearance. The
cerebral hemispheres are symmetric in appearance, with normal
gray-white differentiation. No mass effect or midline shift is seen.

Vascular: No hyperdense vessel or unexpected calcification.

Skull: There is no evidence of fracture; visualized osseous
structures are unremarkable in appearance.

Other: No significant soft tissue abnormalities are seen.

CT MAXILLOFACIAL FINDINGS

Osseous: There is no evidence of fracture or dislocation. The
maxilla and mandible appear intact. The nasal bone is unremarkable
in appearance. Known chipped teeth are not well characterized on CT.
There is slight irregularity of the central maxillary incisors.

Orbits: The orbits are intact bilaterally.

Sinuses: The visualized paranasal sinuses and mastoid air cells are
well-aerated.

Soft tissues: Mild soft tissue swelling is noted at the lower lip.
The parapharyngeal fat planes are preserved. The nasopharynx,
oropharynx and hypopharynx are unremarkable in appearance. The
visualized portions of the valleculae and piriform sinuses are
grossly unremarkable.

The parotid and submandibular glands are within normal limits. No
cervical lymphadenopathy is seen.
IMPRESSION: 1. No evidence of traumatic intracranial injury or fracture.
2. No evidence of fracture or dislocation with regard to the
maxillofacial structures.
3. Mild soft tissue swelling at the lower lip.
4. Known chipped teeth are not well characterized on CT. Slight
irregularity of the central maxillary incisors.
